# Patient Record
Sex: Female | Born: 1960 | Race: White | Hispanic: No | Marital: Single | Smoking: Never smoker
Health system: Southern US, Community
[De-identification: ages and names within clinical notes are randomized; demographics above are authoritative.]

## PROBLEM LIST (undated history)

## (undated) DIAGNOSIS — E079 Disorder of thyroid, unspecified: Secondary | ICD-10-CM

---

## 1998-05-06 ENCOUNTER — Other Ambulatory Visit: Admission: RE | Admit: 1998-05-06 | Discharge: 1998-05-06 | Payer: Self-pay | Admitting: Obstetrics & Gynecology

## 1998-05-30 ENCOUNTER — Ambulatory Visit (HOSPITAL_COMMUNITY): Admission: RE | Admit: 1998-05-30 | Discharge: 1998-05-30 | Payer: Self-pay | Admitting: Obstetrics & Gynecology

## 1999-04-22 ENCOUNTER — Other Ambulatory Visit: Admission: RE | Admit: 1999-04-22 | Discharge: 1999-04-22 | Payer: Self-pay | Admitting: Obstetrics & Gynecology

## 2000-04-28 ENCOUNTER — Other Ambulatory Visit: Admission: RE | Admit: 2000-04-28 | Discharge: 2000-04-28 | Payer: Self-pay | Admitting: Obstetrics & Gynecology

## 2001-05-10 ENCOUNTER — Other Ambulatory Visit: Admission: RE | Admit: 2001-05-10 | Discharge: 2001-05-10 | Payer: Self-pay | Admitting: Obstetrics & Gynecology

## 2002-04-30 ENCOUNTER — Encounter: Payer: Self-pay | Admitting: *Deleted

## 2002-04-30 ENCOUNTER — Emergency Department (HOSPITAL_COMMUNITY): Admission: EM | Admit: 2002-04-30 | Discharge: 2002-04-30 | Payer: Self-pay | Admitting: Emergency Medicine

## 2002-05-11 ENCOUNTER — Other Ambulatory Visit: Admission: RE | Admit: 2002-05-11 | Discharge: 2002-05-11 | Payer: Self-pay | Admitting: Obstetrics & Gynecology

## 2002-08-31 ENCOUNTER — Emergency Department (HOSPITAL_COMMUNITY): Admission: EM | Admit: 2002-08-31 | Discharge: 2002-08-31 | Payer: Self-pay | Admitting: Emergency Medicine

## 2003-05-17 ENCOUNTER — Other Ambulatory Visit: Admission: RE | Admit: 2003-05-17 | Discharge: 2003-05-17 | Payer: Self-pay | Admitting: Obstetrics & Gynecology

## 2004-05-29 ENCOUNTER — Other Ambulatory Visit: Admission: RE | Admit: 2004-05-29 | Discharge: 2004-05-29 | Payer: Self-pay | Admitting: Obstetrics & Gynecology

## 2005-06-08 ENCOUNTER — Other Ambulatory Visit: Admission: RE | Admit: 2005-06-08 | Discharge: 2005-06-08 | Payer: Self-pay | Admitting: Obstetrics & Gynecology

## 2006-08-29 ENCOUNTER — Emergency Department (HOSPITAL_COMMUNITY): Admission: EM | Admit: 2006-08-29 | Discharge: 2006-08-29 | Payer: Self-pay | Admitting: Emergency Medicine

## 2007-12-14 ENCOUNTER — Emergency Department (HOSPITAL_COMMUNITY): Admission: EM | Admit: 2007-12-14 | Discharge: 2007-12-14 | Payer: Self-pay | Admitting: Emergency Medicine

## 2008-10-21 DIAGNOSIS — R002 Palpitations: Secondary | ICD-10-CM

## 2008-10-21 DIAGNOSIS — B009 Herpesviral infection, unspecified: Secondary | ICD-10-CM | POA: Insufficient documentation

## 2008-10-21 DIAGNOSIS — R609 Edema, unspecified: Secondary | ICD-10-CM

## 2008-10-21 DIAGNOSIS — E039 Hypothyroidism, unspecified: Secondary | ICD-10-CM | POA: Insufficient documentation

## 2008-10-23 ENCOUNTER — Ambulatory Visit: Payer: Self-pay | Admitting: Internal Medicine

## 2008-10-23 ENCOUNTER — Encounter: Payer: Self-pay | Admitting: Internal Medicine

## 2008-10-23 DIAGNOSIS — R079 Chest pain, unspecified: Secondary | ICD-10-CM | POA: Insufficient documentation

## 2008-11-04 ENCOUNTER — Telehealth (INDEPENDENT_AMBULATORY_CARE_PROVIDER_SITE_OTHER): Payer: Self-pay | Admitting: Radiology

## 2008-11-05 ENCOUNTER — Ambulatory Visit: Payer: Self-pay

## 2008-11-05 ENCOUNTER — Encounter: Payer: Self-pay | Admitting: Internal Medicine

## 2013-09-14 ENCOUNTER — Other Ambulatory Visit: Payer: Self-pay | Admitting: Cardiology

## 2013-09-14 ENCOUNTER — Ambulatory Visit
Admission: RE | Admit: 2013-09-14 | Discharge: 2013-09-14 | Disposition: A | Discharge status: Home / Self Care | Payer: 59 | Source: Ambulatory Visit | Attending: Cardiology | Admitting: Cardiology

## 2013-09-14 DIAGNOSIS — R0602 Shortness of breath: Secondary | ICD-10-CM

## 2015-03-03 IMAGING — CR DG CHEST 2V
2 series · 2 of 2 positions shown · non-contrast
Comparison: None.

CLINICAL DATA: Dyspnea, chest pain, tachycardia

EXAM:
CHEST  2 VIEW

[view not recorded (1 of 2)]
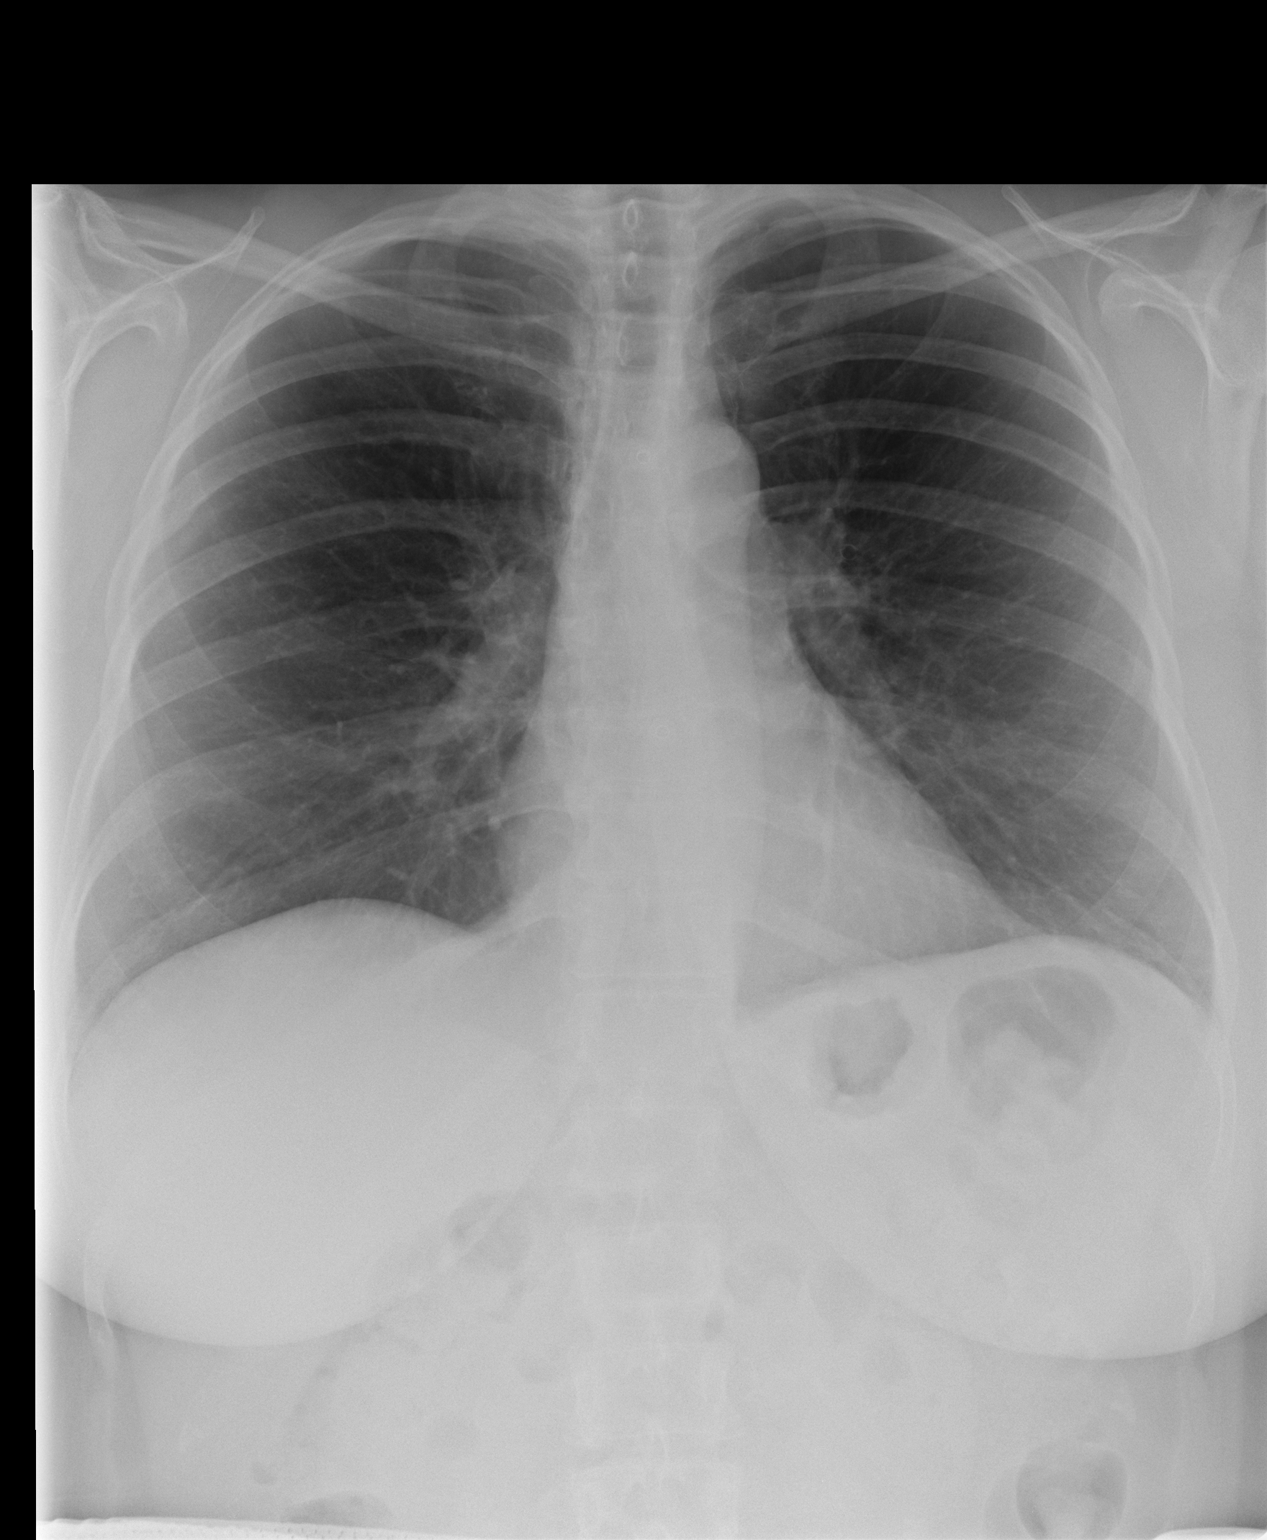

[view not recorded (2 of 2)]
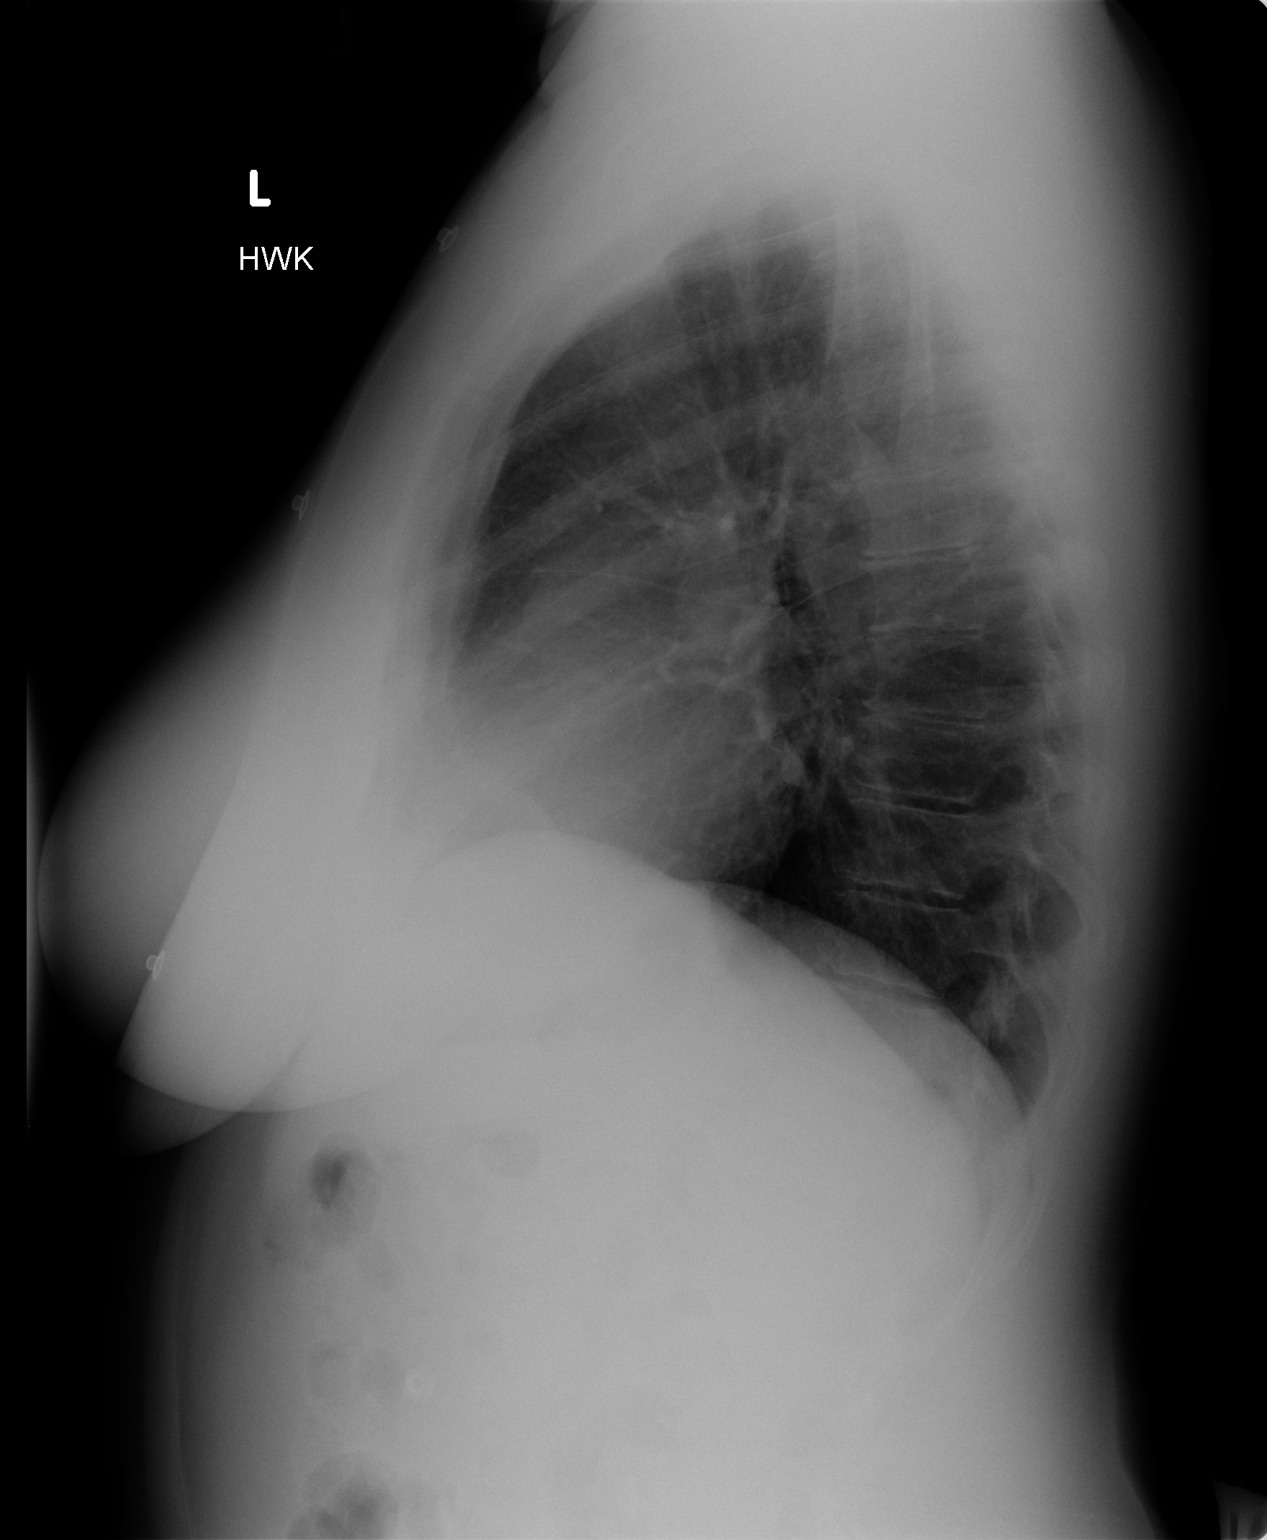

[2 of 2 positions shown; findings below may reference images not displayed]

FINDINGS: The lungs are adequately inflated and clear. The cardiac silhouette
is normal in size. The pulmonary vascularity is not engorged. The
mediastinum is normal in width. There is no pleural effusion. The
observed portions of the bony thorax are normal for age.
IMPRESSION: There is no evidence of pneumonia nor CHF nor other acute
cardiopulmonary disease.

## 2022-03-23 ENCOUNTER — Ambulatory Visit
Admission: EM | Admit: 2022-03-23 | Discharge: 2022-03-23 | Disposition: A | Discharge status: Home / Self Care | Payer: 59

## 2022-03-23 DIAGNOSIS — L03019 Cellulitis of unspecified finger: Secondary | ICD-10-CM

## 2022-03-23 HISTORY — DX: Disorder of thyroid, unspecified: E07.9

## 2022-03-23 MED ORDER — SULFAMETHOXAZOLE-TRIMETHOPRIM 800-160 MG PO TABS: 1.0000 | ORAL_TABLET | Freq: Two times a day (BID) | ORAL | 0 refills | Status: AC

## 2022-03-23 NOTE — ED Triage Notes (Signed)
Pt c/o redness and soreness to finger nails/skin to three different nails x3wks.

## 2022-03-23 NOTE — Discharge Instructions (Signed)
Please continue conservative care as you have been doing.  Please begin taking Bactrim 1 tablet twice daily for the next 7 days.  This medication is safe to take with your thyroid medication but as with any other medication please do not take it at the same time as your thyroid medication.  Please follow-up after 7 days if you do not see meaningful improvement of the infections in your fingers.

## 2022-03-23 NOTE — ED Provider Notes (Signed)
[facility]    CSN: [id number] Arrival date & time: 03/23/22  1337    HISTORY  No chief complaint on file.  HPI Robin Robinson is a pleasant, 61 y.o. female who presents to urgent care today. Patient complains of redness and soreness around her right little finger, her left middle finger and her left thumb nails for the past 3 weeks.  Patient states she has been soaking her fingers and Epsom salt and applying antibiotic ointment twice daily with no meaningful relief of her symptoms.  Patient denies drainage from any of the lesions.  Patient denies fever, red streaking of her fingers, swelling of her hands.  The history is provided by the patient.   Past Medical History:  Diagnosis Date   Thyroid disease    Patient Active Problem List   Diagnosis Date Noted   CHEST PAIN UNSPECIFIED 10/23/2008   HSV 10/21/2008   HYPOTHYROIDISM 10/21/2008   PERIPHERAL EDEMA 10/21/2008   PALPITATIONS 10/21/2008   History reviewed. No pertinent surgical history. OB History   No obstetric history on file.    Home Medications    Prior to Admission medications   Medication Sig Start Date End Date Taking? Authorizing Provider  levothyroxine (SYNTHROID) 125 MCG tablet Take 1 tablet by mouth daily. 05/26/21  Yes [provider]  omeprazole (PRILOSEC) 40 MG capsule  10/02/13  Yes [provider]  sulfamethoxazole-trimethoprim (BACTRIM DS) 800-160 MG tablet Take 1 tablet by mouth 2 (two) times daily for 7 days. 03/23/22 03/30/22 Yes Theadora Rama Scales, PA-C    Family History History reviewed. No pertinent family history. Social History Social History   Tobacco Use   Smoking status: Never   Smokeless tobacco: Never  Substance Use Topics   Alcohol use: Not Currently   Drug use: Not Currently   Allergies   Codeine  Review of Systems Review of Systems Pertinent findings revealed after performing a 14 point review of systems has been noted in the history of  present illness.  Physical Exam Triage Vital Signs ED Triage Vitals  Enc Vitals Group     BP 05/08/21 0827 (!) 147/82     Pulse Rate 05/08/21 0827 72     Resp 05/08/21 0827 18     Temp 05/08/21 0827 98.3 F (36.8 C)     Temp Source 05/08/21 0827 Oral     SpO2 05/08/21 0827 98 %     Weight --      Height --      Head Circumference --      Peak Flow --      Pain Score 05/08/21 0826 5     Pain Loc --      Pain Edu? --      Excl. in GC? --   No data found.  Updated Vital Signs BP (!) 161/73 (BP Location: Left Arm)   Pulse 74   Temp 98.1 F (36.7 C) (Oral)   Resp 18   SpO2 93%   Physical Exam Vitals and nursing note reviewed.  Constitutional:      General: She is not in acute distress.    Appearance: Normal appearance.  HENT:     Head: Normocephalic and atraumatic.  Eyes:     Pupils: Pupils are equal, round, and reactive to light.  Cardiovascular:     Rate and Rhythm: Normal rate and regular rhythm.  Pulmonary:     Effort: Pulmonary effort is normal.     Breath sounds: Normal breath sounds.  Musculoskeletal:        General: Normal range of motion.     Cervical back: Normal range of motion and neck supple.  Skin:    General: Skin is warm and dry.     Comments: Findings concerning for paronychia on right little finger, left middle finger and left thumb.  Neurological:     General: No focal deficit present.     Mental Status: She is alert and oriented to person, place, and time. Mental status is at baseline.  Psychiatric:        Mood and Affect: Mood normal.        Behavior: Behavior normal.        Thought Content: Thought content normal.        Judgment: Judgment normal.     Visual Acuity Right Eye Distance:   Left Eye Distance:   Bilateral Distance:    Right Eye Near:   Left Eye Near:    Bilateral Near:     UC Couse / Diagnostics / Procedures:     Radiology No results found.  Procedures Procedures (including critical care time) EKG  Pending  results:  Labs Reviewed - No data to display  Medications Ordered in UC: Medications - No data to display  UC Diagnoses / Final Clinical Impressions(s)   I have reviewed the triage vital signs and the nursing notes.  Pertinent labs & imaging results that were available during my care of the patient were reviewed by me and considered in my medical decision making (see chart for details).    Final diagnoses:  Paronychia of finger, unspecified laterality   Patient advised to begin Bactrim and to continue conservative care measures.  Return precautions advised.   ED Prescriptions     Medication Sig Dispense Auth. Provider   sulfamethoxazole-trimethoprim (BACTRIM DS) 800-160 MG tablet Take 1 tablet by mouth 2 (two) times daily for 7 days. 14 tablet Theadora Rama Scales, PA-C      PDMP not reviewed this encounter.  Pending results:  Labs Reviewed - No data to display  Discharge Instructions:   Discharge Instructions      Please continue conservative care as you have been doing.  Please begin taking Bactrim 1 tablet twice daily for the next 7 days.  This medication is safe to take with your thyroid medication but as with any other medication please do not take it at the same time as your thyroid medication.  Please follow-up after 7 days if you do not see meaningful improvement of the infections in your fingers.    Disposition Upon Discharge:  Condition: stable for discharge home  Patient presented with an acute illness with associated systemic symptoms and significant discomfort requiring urgent management. In my opinion, this is a condition that a prudent lay person (someone who possesses an average knowledge of health and medicine) may potentially expect to result in complications if not addressed urgently such as respiratory distress, impairment of bodily function or dysfunction of bodily organs.   Routine symptom specific, illness specific and/or disease specific  instructions were discussed with the patient and/or caregiver at length.   As such, the patient has been evaluated and assessed, work-up was performed and treatment was provided in alignment with urgent care protocols and evidence based medicine.  Patient/parent/caregiver has been advised that the patient may require follow up for further testing and treatment if the symptoms continue in spite of treatment, as clinically indicated and appropriate.  Patient/parent/caregiver has been advised to return  to the [facility] or PCP if no better; to PCP or the Emergency Department if new signs and symptoms develop, or if the current signs or symptoms continue to change or worsen for further workup, evaluation and treatment as clinically indicated and appropriate  The patient will follow up with their current PCP if and as advised. If the patient does not currently have a PCP we will assist them in obtaining one.   The patient may need specialty follow up if the symptoms continue, in spite of conservative treatment and management, for further workup, evaluation, consultation and treatment as clinically indicated and appropriate.   Patient/parent/caregiver verbalized understanding and agreement of plan as discussed.  All questions were addressed during visit.  Please see discharge instructions below for further details of plan.  This office note has been dictated using Teaching laboratory technician.  Unfortunately, this method of dictation can sometimes lead to typographical or grammatical errors.  I apologize for your inconvenience in advance if this occurs.  Please do not hesitate to reach out to me if clarification is needed.      Theadora Rama Scales, PA-C 03/23/22 1421

## 2022-10-18 ENCOUNTER — Other Ambulatory Visit: Payer: Self-pay | Admitting: Obstetrics & Gynecology

## 2022-10-18 DIAGNOSIS — R928 Other abnormal and inconclusive findings on diagnostic imaging of breast: Secondary | ICD-10-CM

## 2022-10-27 ENCOUNTER — Ambulatory Visit
Admission: RE | Admit: 2022-10-27 | Discharge: 2022-10-27 | Disposition: A | Discharge status: Home / Self Care | Payer: 59 | Source: Ambulatory Visit | Attending: Obstetrics & Gynecology | Admitting: Obstetrics & Gynecology

## 2022-10-27 ENCOUNTER — Ambulatory Visit: Payer: 59

## 2022-10-27 DIAGNOSIS — R928 Other abnormal and inconclusive findings on diagnostic imaging of breast: Secondary | ICD-10-CM
